# Patient Record
Sex: Female | Born: 1995 | Race: Black or African American | Hispanic: No | Marital: Single | Smoking: Never smoker
Health system: Southern US, Community
[De-identification: ages and names within clinical notes are randomized; demographics above are authoritative.]

---
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
## 2014-08-17 ENCOUNTER — Encounter (HOSPITAL_COMMUNITY): Payer: Self-pay | Admitting: Emergency Medicine

## 2014-08-17 ENCOUNTER — Emergency Department (INDEPENDENT_AMBULATORY_CARE_PROVIDER_SITE_OTHER)
Admission: EM | Admit: 2014-08-17 | Discharge: 2014-08-17 | Disposition: A | Discharge status: Home / Self Care | Payer: Self-pay | Source: Home / Self Care | Attending: Family Medicine | Admitting: Family Medicine

## 2014-08-17 DIAGNOSIS — L503 Dermatographic urticaria: Secondary | ICD-10-CM
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
## 2014-08-17 NOTE — Discharge Instructions (Signed)
Use benadryl or claritin (loratadine) to control the rash and itching.    Hives Hives are itchy, red, swollen areas of the skin. They can vary in size and location on your body. Hives can come and go for hours or several days (acute hives) or for several weeks (chronic hives). Hives do not spread from person to person (noncontagious). They may get worse with scratching, exercise, and emotional stress. CAUSES   Allergic reaction to food, additives, or drugs.  Infections, including the common cold.  Illness, such as vasculitis, lupus, or thyroid disease.  Exposure to sunlight, heat, or cold.  Exercise.  Stress.  Contact with chemicals. SYMPTOMS   Red or white swollen patches on the skin. The patches may change size, shape, and location quickly and repeatedly.  Itching.  Swelling of the hands, feet, and face. This may occur if hives develop deeper in the skin. DIAGNOSIS  Your caregiver can usually tell what is wrong by performing a physical exam. Skin or blood tests may also be done to determine the cause of your hives. In some cases, the cause cannot be determined. TREATMENT  Mild cases usually get better with medicines such as antihistamines. Severe cases may require an emergency epinephrine injection. If the cause of your hives is known, treatment includes avoiding that trigger.  HOME CARE INSTRUCTIONS   Avoid causes that trigger your hives.  Take antihistamines as directed by your caregiver to reduce the severity of your hives. Non-sedating or low-sedating antihistamines are usually recommended. Do not drive while taking an antihistamine.  Take any other medicines prescribed for itching as directed by your caregiver.  Wear loose-fitting clothing.  Keep all follow-up appointments as directed by your caregiver. SEEK MEDICAL CARE IF:   You have persistent or severe itching that is not relieved with medicine.  You have painful or swollen joints. SEEK IMMEDIATE MEDICAL CARE  IF:   You have a fever.  Your tongue or lips are swollen.  You have trouble breathing or swallowing.  You feel tightness in the throat or chest.  You have abdominal pain. These problems may be the first sign of a life-threatening allergic reaction. Call your local emergency services (911 in [place] MAKE SURE YOU:   Understand these instructions.  Will watch your condition.  Will get help right away if you are not doing well or get worse. Document Released: 08/31/2005 Document Revised: 09/05/2013 Document Reviewed: 11/24/2011 [facility] Patient Information 2015 [employer]. This information is not intended to replace advice given to you by your health care provider. Make sure you discuss any questions you have with your health care provider.

## 2014-08-17 NOTE — ED Provider Notes (Signed)
CSN: [id number]     Arrival date & time 08/17/14  [id number] History   First MD Initiated Contact with Patient 08/17/14 [phone]     Chief Complaint  Patient presents with  . Pruritis   (Consider location/radiation/quality/duration/timing/severity/associated sxs/prior Treatment) HPI Comments: Pt gets hives on and off. Lately has been happening at night, she takes benadryl and wakes up with no rash. Cannot identify an allergen trigger. Has been under increased stress lately. Can scratch skin and then a welt will appear  Patient is a 18 y.o. female presenting with rash. The history is provided by the patient.  Rash Location:  Shoulder/arm and torso Shoulder/arm rash location:  L forearm and R forearm Torso rash location:  Upper back Quality: itchiness   Quality: not red   Severity:  Moderate Onset quality:  Gradual Duration:  4 months Timing:  Sporadic Progression:  Waxing and waning Chronicity:  Recurrent Relieved by:  Antihistamines Worsened by:  Nothing tried Associated symptoms: no shortness of breath, no throat swelling and not wheezing     History reviewed. No pertinent past medical history. History reviewed. No pertinent past surgical history. History reviewed. No pertinent family history. History  Substance Use Topics  . Smoking status: Not on file  . Smokeless tobacco: Not on file  . Alcohol Use: Not on file   OB History    No data available     Review of Systems  Respiratory: Negative for shortness of breath and wheezing.   Skin: Positive for rash.    Allergies  Shellfish allergy and Strawberry  Home Medications   Prior to Admission medications   Medication Sig Start Date End Date Taking? Authorizing Provider  diphenhydrAMINE (BENADRYL) 25 mg capsule Take 25 mg by mouth every 6 (six) hours as needed for itching.   Yes Historical Provider, MD   BP 142/92 mmHg  Pulse 92  Temp(Src) 97.9 F (36.6 C) (Oral)  SpO2 100% Physical Exam  Constitutional: She appears  well-developed and well-nourished. No distress.  Pulmonary/Chest: Effort normal.  Skin: Skin is warm and dry. No rash noted.  I marked skin with fingernail and welt began to appear; otherwise no rash on skin at this time    ED Course  Procedures (including critical care time) Labs Review Labs Reviewed - No data to display  Imaging Review No results found.   MDM   1. Dermatographic urticaria    Continue benadryl prn or switch to claritin    Cathlyn ParsonsAngela M Kabbe, NP 08/17/14 [phone]

## 2022-07-02 ENCOUNTER — Ambulatory Visit (HOSPITAL_COMMUNITY)
Admission: EM | Admit: 2022-07-02 | Discharge: 2022-07-02 | Disposition: A | Discharge status: Home / Self Care | Payer: Self-pay | Attending: Internal Medicine | Admitting: Internal Medicine

## 2022-07-02 ENCOUNTER — Encounter (HOSPITAL_COMMUNITY): Payer: Self-pay

## 2022-07-02 DIAGNOSIS — G4486 Cervicogenic headache: Secondary | ICD-10-CM

## 2022-07-02 DIAGNOSIS — M26621 Arthralgia of right temporomandibular joint: Secondary | ICD-10-CM

## 2022-07-02 MED ORDER — IBUPROFEN 600 MG PO TABS: 600.0000 mg | ORAL_TABLET | Freq: Four times a day (QID) | ORAL | 0 refills | Status: AC | PRN

## 2022-07-02 MED ORDER — METHOCARBAMOL 500 MG PO TABS: 500.0000 mg | ORAL_TABLET | Freq: Every evening | ORAL | 0 refills | Status: AC | PRN

## 2022-07-02 NOTE — Discharge Instructions (Addendum)
Please take medications as prescribed Gentle stretching exercises of the neck once the pain improves Heating pad use will help with neck pain.  Use heating pad on a 20-minute of 20-minute on cycle. Icing of the right jaw with help with right-sided facial/ear pain Please do not drive or operate heavy machinery after taking muscle relaxants because the muscle relaxants will make you drowsy. Return to urgent care if symptoms worsen.

## 2022-07-02 NOTE — ED Triage Notes (Signed)
Sharp pain in the right ear. Thinks she has an ear infection. Onset of symptoms yesterday/ Patient states she is prone to ear infections.

## 2022-07-03 NOTE — ED Provider Notes (Signed)
[facility]    CSN: [id number] Arrival date & time: 07/02/22  1527      History   Chief Complaint Chief Complaint  Patient presents with   Otalgia    HPI Brittney Ferguson is a 26 y.o. female comes to the urgent care with right ear pain of 1 day duration.  Patient describes the pain as sharp, intermittent with no known aggravating or relieving factors.  She also complains of right-sided headache that emanates from the occipital area extending into the temporoparietal area.  No fever or chills.  No light sensitivity.  She denies any trauma to the neck.  No double vision or blurry vision.  No discharge from the ear.  No ringing in ears or hearing loss. HPI  History reviewed. No pertinent past medical history.  There are no problems to display for this patient.   History reviewed. No pertinent surgical history.  OB History   No obstetric history on file.      Home Medications    Prior to Admission medications   Medication Sig Start Date End Date Taking? Authorizing Provider  ibuprofen (ADVIL) 600 MG tablet Take 1 tablet (600 mg total) by mouth every 6 (six) hours as needed. 07/02/22  Yes Maryetta Shafer, Britta Mccreedy, MD  methocarbamol (ROBAXIN) 500 MG tablet Take 1 tablet (500 mg total) by mouth at bedtime as needed for muscle spasms. 07/02/22  Yes Traxton Kolenda, Britta Mccreedy, MD  diphenhydrAMINE (BENADRYL) 25 mg capsule Take 25 mg by mouth every 6 (six) hours as needed for itching.    [provider]    Family History History reviewed. No pertinent family history.  Social History Social History   Tobacco Use   Smoking status: Never   Smokeless tobacco: Never  Vaping Use   Vaping Use: Never used  Substance Use Topics   Alcohol use: Not Currently   Drug use: Never     Allergies   Shellfish allergy and Strawberry extract   Review of Systems Review of Systems  HENT:  Positive for ear pain. Negative for ear discharge, postnasal drip, sinus pressure and sinus  pain.   Genitourinary: Negative.   Neurological:  Positive for headaches.     Physical Exam Triage Vital Signs ED Triage Vitals  Enc Vitals Group     BP 07/02/22 1624 119/83     Pulse Rate 07/02/22 1624 86     Resp 07/02/22 1624 16     Temp 07/02/22 1624 98.3 F (36.8 C)     Temp Source 07/02/22 1624 Oral     SpO2 07/02/22 1624 100 %     Weight --      Height --      Head Circumference --      Peak Flow --      Pain Score 07/02/22 1622 10     Pain Loc --      Pain Edu? --      Excl. in GC? --    No data found.  Updated Vital Signs BP 119/83 (BP Location: Right Arm)   Pulse 86   Temp 98.3 F (36.8 C) (Oral)   Resp 16   LMP 06/14/2022 (Exact Date)   SpO2 100%   Visual Acuity Right Eye Distance:   Left Eye Distance:   Bilateral Distance:    Right Eye Near:   Left Eye Near:    Bilateral Near:     Physical Exam Vitals and nursing note reviewed.  Constitutional:  General: She is not in acute distress.    Appearance: She is not ill-appearing.  HENT:     Mouth/Throat:     Comments: TMJ clicking on both sides.  No tenderness on palpation. Cardiovascular:     Rate and Rhythm: Normal rate and regular rhythm.  Neurological:     General: No focal deficit present.     Mental Status: She is alert and oriented to person, place, and time. Mental status is at baseline.  Psychiatric:        Mood and Affect: Mood normal.        Behavior: Behavior normal.      UC Treatments / Results  Labs (all labs ordered are listed, but only abnormal results are displayed) Labs Reviewed - No data to display  EKG   Radiology No results found.  Procedures Procedures (including critical care time)  Medications Ordered in UC Medications - No data to display  Initial Impression / Assessment and Plan / UC Course  I have reviewed the triage vital signs and the nursing notes.  Pertinent labs & imaging results that were available during my care of the patient were  reviewed by me and considered in my medical decision making (see chart for details).     1.  Cervicogenic headache: Ibuprofen as needed for pain Robaxin as needed for muscle spasms Heating pad use only 20 minutes on-20 minutes off cycle Gentle range of motion exercises Return precautions given  2.  Arthralgia of the right TMJ: Management as above If symptoms persist patient may benefit from ENT evaluation. Final Clinical Impressions(s) / UC Diagnoses   Final diagnoses:  Cervicogenic headache  Arthralgia of right temporomandibular joint     Discharge Instructions      Please take medications as prescribed Gentle stretching exercises of the neck once the pain improves Heating pad use will help with neck pain.  Use heating pad on a 20-minute of 20-minute on cycle. Icing of the right jaw with help with right-sided facial/ear pain Please do not drive or operate heavy machinery after taking muscle relaxants because the muscle relaxants will make you drowsy. Return to urgent care if symptoms worsen.    ED Prescriptions     Medication Sig Dispense Auth. Provider   ibuprofen (ADVIL) 600 MG tablet Take 1 tablet (600 mg total) by mouth every 6 (six) hours as needed. 30 tablet Erma Raiche, Myrene Galas, MD   methocarbamol (ROBAXIN) 500 MG tablet Take 1 tablet (500 mg total) by mouth at bedtime as needed for muscle spasms. 10 tablet Brack Shaddock, Myrene Galas, MD      PDMP not reviewed this encounter.   Chase Picket, MD 07/03/22 [phone]
